# Patient Record
Sex: Male | Born: 1990 | Race: White | Hispanic: No | Marital: Single | Smoking: Current every day smoker
Health system: Southern US, Community
[De-identification: ages and names within clinical notes are randomized; demographics above are authoritative.]

## PROBLEM LIST (undated history)

## (undated) HISTORY — PX: APPENDECTOMY: SHX54

---

## 2015-04-13 ENCOUNTER — Ambulatory Visit (INDEPENDENT_AMBULATORY_CARE_PROVIDER_SITE_OTHER): Payer: BLUE CROSS/BLUE SHIELD

## 2015-04-13 ENCOUNTER — Encounter: Payer: Self-pay | Admitting: Podiatry

## 2015-04-13 ENCOUNTER — Ambulatory Visit (INDEPENDENT_AMBULATORY_CARE_PROVIDER_SITE_OTHER): Payer: BLUE CROSS/BLUE SHIELD | Admitting: Podiatry

## 2015-04-13 VITALS — BP 119/70 | HR 74 | Resp 16 | Ht 72.0 in | Wt 245.0 lb

## 2015-04-13 DIAGNOSIS — M79672 Pain in left foot: Secondary | ICD-10-CM

## 2015-04-13 DIAGNOSIS — M779 Enthesopathy, unspecified: Secondary | ICD-10-CM | POA: Diagnosis not present

## 2015-04-13 DIAGNOSIS — M79671 Pain in right foot: Secondary | ICD-10-CM | POA: Diagnosis not present

## 2015-04-13 DIAGNOSIS — M2040 Other hammer toe(s) (acquired), unspecified foot: Secondary | ICD-10-CM

## 2015-04-13 DIAGNOSIS — B372 Candidiasis of skin and nail: Secondary | ICD-10-CM | POA: Diagnosis not present

## 2015-04-13 MED ORDER — DICLOFENAC SODIUM 75 MG PO TBEC: 75.0000 mg | DELAYED_RELEASE_TABLET | Freq: Two times a day (BID) | ORAL | Status: DC

## 2015-04-13 MED ORDER — TERBINAFINE HCL 250 MG PO TABS: ORAL_TABLET | ORAL | Status: DC

## 2015-04-13 MED ORDER — TRIAMCINOLONE ACETONIDE 10 MG/ML IJ SUSP
10.0000 mg | Freq: Once | INTRAMUSCULAR | Status: AC
  Administered 2015-04-13: 10 mg

## 2015-04-13 NOTE — Progress Notes (Signed)
   Subjective:    Patient ID: Benjamin Clay, male    DOB: 06/25/1990, 25 y.o.   MRN: [id number]  HPI Patient presents with foot pain in their left foot; plantar forefoot-below 4th toe. Pt stated, "foot started to hurt after playing basketball";  x2 weeks   Review of Systems  All other systems reviewed and are negative.      Objective:   Physical Exam        Assessment & Plan:

## 2015-04-15 NOTE — Progress Notes (Signed)
Subjective:     Patient ID: Benjamin Clay, male   DOB: 05-Dec-1990, 25 y.o.   MRN: [id number]  HPI patient presents with a lot of pain underneath his left forefoot in the area of the fourth metatarsal phalangeal joint. Also has relatively dry skin bilateral that's localized in nature with nail disease which is been present. Patient has been present to the worse degree and the foot over the last few weeks   Review of Systems  All other systems reviewed and are negative.      Objective:   Physical Exam  Constitutional: He is oriented to person, place, and time.  Cardiovascular: Intact distal pulses.   Musculoskeletal: Normal range of motion.  Neurological: He is oriented to person, place, and time.  Skin: Skin is warm.  Nursing note and vitals reviewed.  Neurovascular status intact muscle strength adequate range of motion within normal limits with patient found to have discomfort in the fourth metatarsophalangeal joint left with elevation of the toe and pain within the joint and distal. Patient also has dry skin bilateral and history of interdigital itching and discoloration. Patient has good digital perfusion is well oriented 3     Assessment:     Probable inflammatory capsulitis left fourth MPJ with mycotic foot infection occurring bilateral    Plan:     H&P and x-rays left foot reviewed. Today I did a proximal nerve block left I aspirated the joint getting out a small amount of clear fluid and injected with half cc dexamethasone Kenalog and applied thick pad to reduce pressure on the joint surface. Discussed digital deformity and that it may need to be corrected at some day. Also placed him on Lamisil post pack to help him with his fungal infection and reappoint in 2 weeks  Indicates that there is no indications of fracture or arthritis with mild elevation of the digit noted

## 2015-04-27 ENCOUNTER — Ambulatory Visit: Payer: BLUE CROSS/BLUE SHIELD | Admitting: Podiatry

## 2015-09-09 ENCOUNTER — Encounter: Payer: Self-pay | Admitting: Emergency Medicine

## 2015-09-09 ENCOUNTER — Emergency Department
Admission: EM | Admit: 2015-09-09 | Discharge: 2015-09-09 | Disposition: A | Discharge status: Home / Self Care | Payer: BLUE CROSS/BLUE SHIELD | Attending: Emergency Medicine | Admitting: Emergency Medicine

## 2015-09-09 ENCOUNTER — Emergency Department: Payer: BLUE CROSS/BLUE SHIELD

## 2015-09-09 DIAGNOSIS — S40212A Abrasion of left shoulder, initial encounter: Secondary | ICD-10-CM | POA: Insufficient documentation

## 2015-09-09 DIAGNOSIS — Y9264 Mine or pit as the place of occurrence of the external cause: Secondary | ICD-10-CM | POA: Insufficient documentation

## 2015-09-09 DIAGNOSIS — F17210 Nicotine dependence, cigarettes, uncomplicated: Secondary | ICD-10-CM | POA: Insufficient documentation

## 2015-09-09 DIAGNOSIS — Y999 Unspecified external cause status: Secondary | ICD-10-CM | POA: Diagnosis not present

## 2015-09-09 DIAGNOSIS — T07XXXA Unspecified multiple injuries, initial encounter: Secondary | ICD-10-CM

## 2015-09-09 DIAGNOSIS — S40012A Contusion of left shoulder, initial encounter: Secondary | ICD-10-CM

## 2015-09-09 DIAGNOSIS — S4992XA Unspecified injury of left shoulder and upper arm, initial encounter: Secondary | ICD-10-CM | POA: Diagnosis present

## 2015-09-09 DIAGNOSIS — Y9389 Activity, other specified: Secondary | ICD-10-CM | POA: Diagnosis not present

## 2015-09-09 MED ORDER — OXYCODONE-ACETAMINOPHEN 5-325 MG PO TABS
1.0000 | ORAL_TABLET | Freq: Once | ORAL | Status: AC
  Administered 2015-09-09: 1 via ORAL
  Filled 2015-09-09: qty 1
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
## 2015-09-09 NOTE — ED Notes (Signed)
Ice pack applied.

## 2015-09-09 NOTE — ED Notes (Addendum)
Pt status discussed with Dr. Derrill KayGoodman. Ct of head / neck offered to pt per MD request. Pt refused. States he currently has no pain to his head or neck.

## 2016-12-09 ENCOUNTER — Ambulatory Visit
Admission: EM | Admit: 2016-12-09 | Discharge: 2016-12-09 | Disposition: A | Discharge status: Home / Self Care | Payer: BLUE CROSS/BLUE SHIELD | Attending: Family Medicine | Admitting: Family Medicine

## 2016-12-09 ENCOUNTER — Encounter: Payer: Self-pay | Admitting: *Deleted

## 2016-12-09 DIAGNOSIS — J069 Acute upper respiratory infection, unspecified: Secondary | ICD-10-CM | POA: Diagnosis not present

## 2016-12-09 DIAGNOSIS — R040 Epistaxis: Secondary | ICD-10-CM | POA: Diagnosis not present

## 2016-12-09 MED ORDER — BENZONATATE 200 MG PO CAPS: ORAL_CAPSULE | ORAL | 0 refills | Status: DC

## 2016-12-09 MED ORDER — FLUTICASONE PROPIONATE 50 MCG/ACT NA SUSP: 2.0000 | Freq: Every day | NASAL | 0 refills | Status: DC

## 2016-12-09 NOTE — Discharge Instructions (Signed)
Flonase on a daily basis.  Recommend coolmist vaporizer at night while the heat is on.  Follow up with ENT

## 2016-12-09 NOTE — ED Provider Notes (Signed)
[facility]    CSN: [id number] Arrival date & time: 12/09/16  1253     History   Chief Complaint Chief Complaint  Patient presents with  . Cough  . Epistaxis    HPI Benjamin Clay is a 26 y.o. male.   HPI  26 year old male who presents with a productive cough of yellow sputum sinus drainage and a nosebleed that began this morning from the left naris.  He was able to stop the bleeding area the symptoms have been progressing over 4-day period of time.  Eyes any fever or chills.  Non-smoker.        History reviewed. No pertinent past medical history.  There are no active problems to display for this patient.   Past Surgical History:  Procedure Laterality Date  . APPENDECTOMY         Home Medications    Prior to Admission medications   Medication Sig Start Date End Date Taking? Authorizing Provider  benzonatate (TESSALON) 200 MG capsule Take one cap TID PRN cough 12/09/16   Ovid Curdoemer, Chauncy Mangiaracina P, PA-C  fluticasone [facility](FLONASE) 50 MCG/ACT nasal spray Place 2 sprays into both nostrils daily. 12/09/16   Lutricia Feiloemer, Deja Pisarski P, PA-C    Family History Family History  Problem Relation Age of Onset  . Healthy Mother   . Healthy Father     Social History Social History   Tobacco Use  . Smoking status: Current Every Day Smoker    Packs/day: 0.50    Types: Cigarettes  . Smokeless tobacco: Never Used  Substance Use Topics  . Alcohol use: Yes    Alcohol/week: 0.0 oz  . Drug use: Not on file     Allergies   Patient has no known allergies.   Review of Systems Review of Systems  Constitutional: Positive for activity change. Negative for chills, fatigue and fever.  HENT: Positive for congestion, nosebleeds, postnasal drip and rhinorrhea.   Respiratory: Positive for cough.   All other systems reviewed and are negative.    Physical Exam Triage Vital Signs ED Triage Vitals  Enc Vitals Group     BP 12/09/16 1324 127/72     Pulse Rate 12/09/16 1324 (!) 58       Resp 12/09/16 1324 16     Temp 12/09/16 1324 98.1 F (36.7 C)     Temp Source 12/09/16 1324 Oral     SpO2 12/09/16 1324 98 %     Weight 12/09/16 1326 210 lb (95.3 kg)     Height 12/09/16 1326 6\' 1"  (1.854 m)     Head Circumference --      Peak Flow --      Pain Score --      Pain Loc --      Pain Edu? --      Excl. in GC? --    No data found.  Updated Vital Signs BP 127/72 (BP Location: Left Arm)   Pulse (!) 58   Temp 98.1 F (36.7 C) (Oral)   Resp 16   Ht 6\' 1"  (1.854 m)   Wt 210 lb (95.3 kg)   SpO2 98%   BMI 27.71 kg/m   Visual Acuity Right Eye Distance:   Left Eye Distance:   Bilateral Distance:    Right Eye Near:   Left Eye Near:    Bilateral Near:     Physical Exam  Constitutional: He is oriented to person, place, and time. He appears well-developed and well-nourished. No distress.  HENT:  Head: Normocephalic.  Right Ear: External ear normal.  Left Ear: External ear normal.  Mouth/Throat: Oropharynx is clear and moist. No oropharyngeal exudate.  Examination of the nostrils shows the left nostril to be more erythematous along the turbinate.  No active bleeding is seen.  Eyes: Pupils are equal, round, and reactive to light. Right eye exhibits no discharge. Left eye exhibits no discharge.  Neck: Normal range of motion.  Pulmonary/Chest: Effort normal and breath sounds normal.  Musculoskeletal: Normal range of motion.  Lymphadenopathy:    He has no cervical adenopathy.  Neurological: He is alert and oriented to person, place, and time.  Skin: Skin is warm and dry. He is not diaphoretic.  Psychiatric: He has a normal mood and affect. His behavior is normal. Judgment and thought content normal.  Nursing note and vitals reviewed.    UC Treatments / Results  Labs (all labs ordered are listed, but only abnormal results are displayed) Labs Reviewed - No data to display  EKG  EKG Interpretation None       Radiology No results  found.  Procedures Procedures (including critical care time)  Medications Ordered in UC Medications - No data to display   Initial Impression / Assessment and Plan / UC Course  I have reviewed the triage vital signs and the nursing notes.  Pertinent labs & imaging results that were available during my care of the patient were reviewed by me and considered in my medical decision making (see chart for details).     Plan: 1. Test/x-ray results and diagnosis reviewed with patient 2. rx as per orders; risks, benefits, potential side effects reviewed with patient 3. Recommend supportive treatment with cool mist vaporizer at nighttime.  Flonase daily.  Cautioned regarding possibility of increased bleeding.  Recommend follow-up with an ENT.  Name and phone number provided to the patient. 4. F/u prn if symptoms worsen or don't improve   Final Clinical Impressions(s) / UC Diagnoses   Final diagnoses:  Epistaxis  Upper respiratory tract infection, unspecified type    ED Discharge Orders        Ordered    benzonatate (TESSALON) 200 MG capsule     12/09/16 1420    fluticasone (FLONASE) 50 MCG/ACT nasal spray  Daily     12/09/16 1420       Controlled Substance Prescriptions [place] Controlled Substance Registry consulted? Not Applicable   Lutricia FeilRoemer, Yasmin Dibello P, PA-C 12/09/16 1431

## 2016-12-09 NOTE — ED Triage Notes (Signed)
Productive cough- yellow, nose bleed this am, bleeding presently controlled, and head congestion x 4 days.
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
## 2020-11-26 ENCOUNTER — Ambulatory Visit: Admit: 2020-11-26 | Discharge status: Absent without leave | Payer: BLUE CROSS/BLUE SHIELD

## 2021-06-09 ENCOUNTER — Ambulatory Visit
Admission: EM | Admit: 2021-06-09 | Discharge: 2021-06-09 | Disposition: A | Discharge status: Home / Self Care | Payer: BLUE CROSS/BLUE SHIELD | Attending: Emergency Medicine | Admitting: Emergency Medicine

## 2021-06-09 ENCOUNTER — Other Ambulatory Visit: Payer: Self-pay

## 2021-06-09 ENCOUNTER — Encounter: Payer: Self-pay | Admitting: Emergency Medicine

## 2021-06-09 ENCOUNTER — Ambulatory Visit (INDEPENDENT_AMBULATORY_CARE_PROVIDER_SITE_OTHER): Payer: BLUE CROSS/BLUE SHIELD

## 2021-06-09 DIAGNOSIS — S60551A Superficial foreign body of right hand, initial encounter: Secondary | ICD-10-CM

## 2021-06-09 MED ORDER — TETANUS-DIPHTH-ACELL PERTUSSIS 5-2.5-18.5 LF-MCG/0.5 IM SUSY: 0.5000 mL | PREFILLED_SYRINGE | Freq: Once | INTRAMUSCULAR | Status: DC

## 2021-06-09 MED ORDER — AMOXICILLIN-POT CLAVULANATE 875-125 MG PO TABS: 1.0000 | ORAL_TABLET | Freq: Two times a day (BID) | ORAL | 0 refills | Status: AC

## 2021-06-09 NOTE — Discharge Instructions (Addendum)
I have put an urgent referral to Dr. Amanda Pea in [place], but Dr. Ardyth Harps is a hand surgeon here in [place].  Please call either office and try and get an appointment with them as soon as you can.  Warm compresses.  Augmentin as I am concerned that this could get secondarily infected.
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
## 2021-06-09 NOTE — ED Triage Notes (Signed)
Patient states that he thinks that he still might have a splinter stuck in his right hand for about a week.  Patient reports swelling and pain at the site.

## 2023-02-26 IMAGING — CR DG HAND COMPLETE 3+V*R*
3 series · 3 of 3 positions shown · non-contrast
Comparison: None Available.

CLINICAL DATA: Splinter in hand for 1 week.  Swelling and pain.

EXAM:
RIGHT HAND - COMPLETE 3+ VIEW

[hand ap]
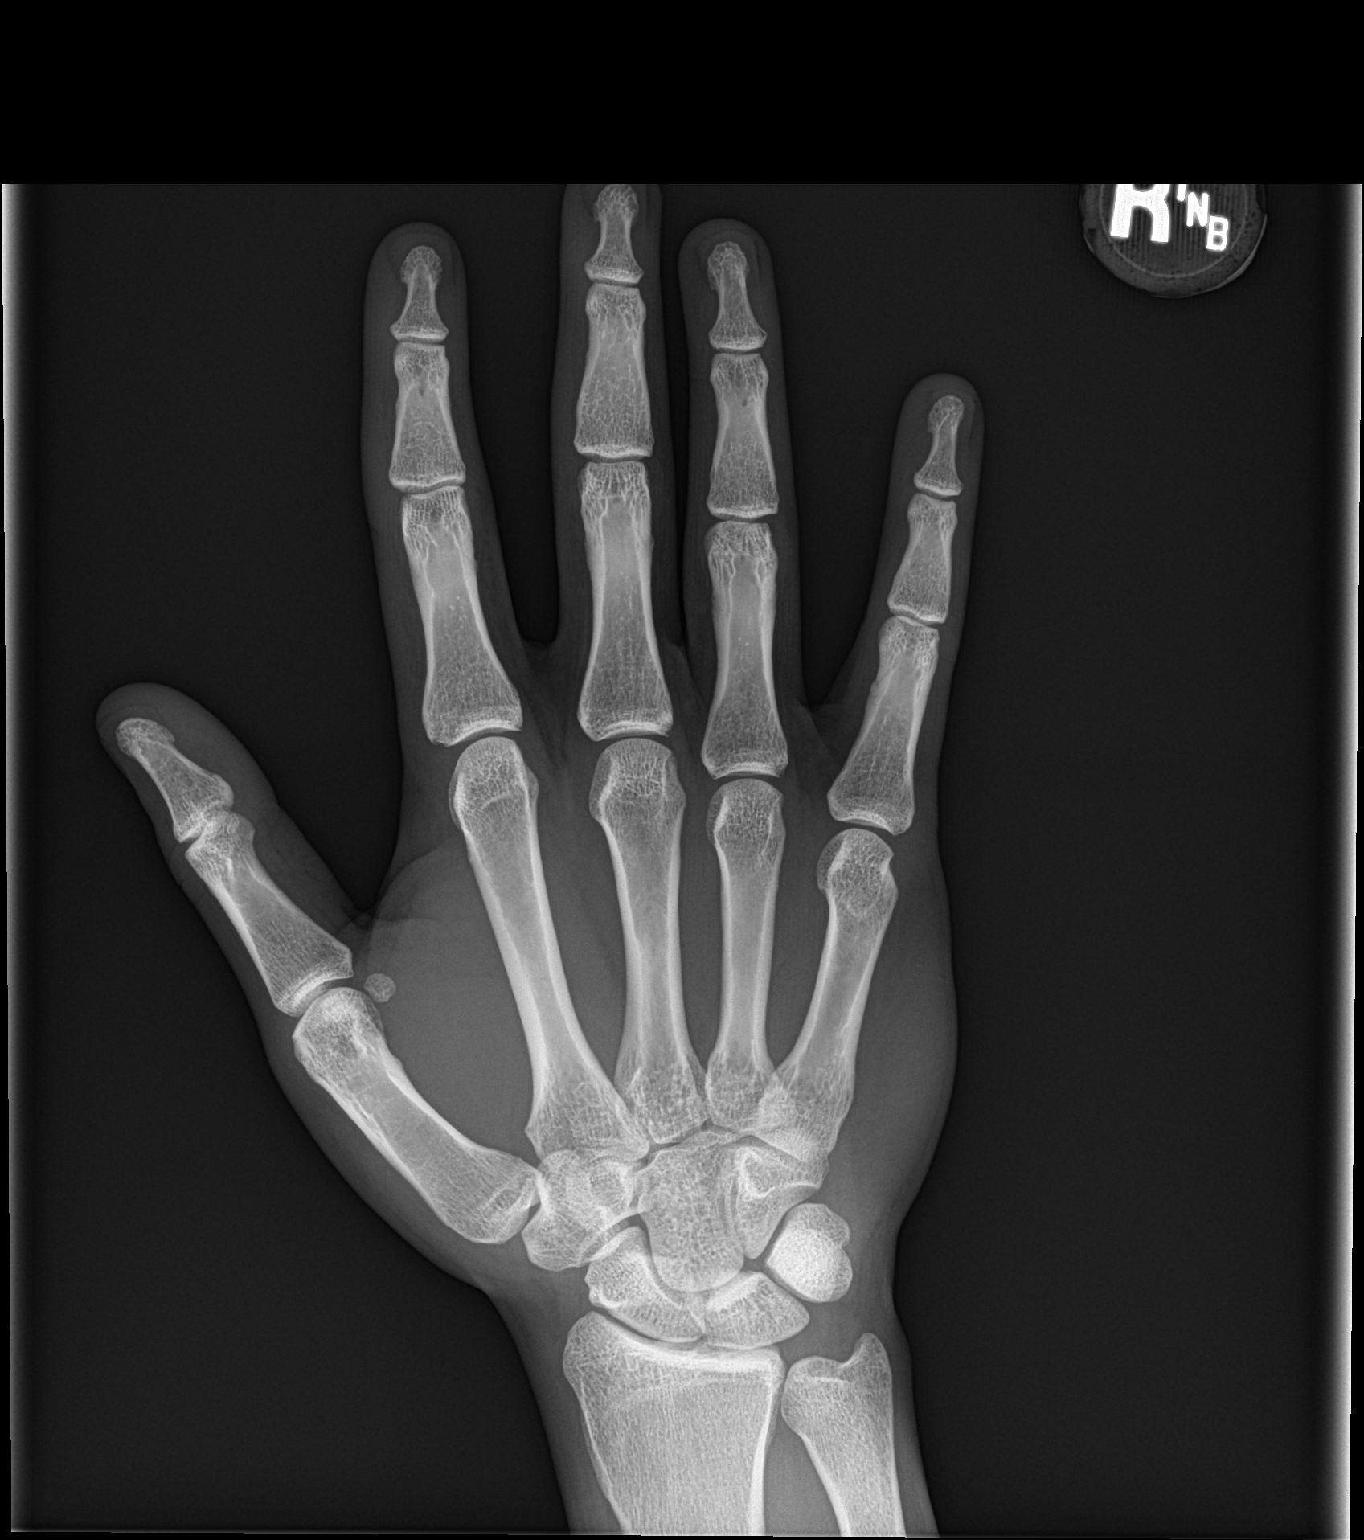

[hand obl]
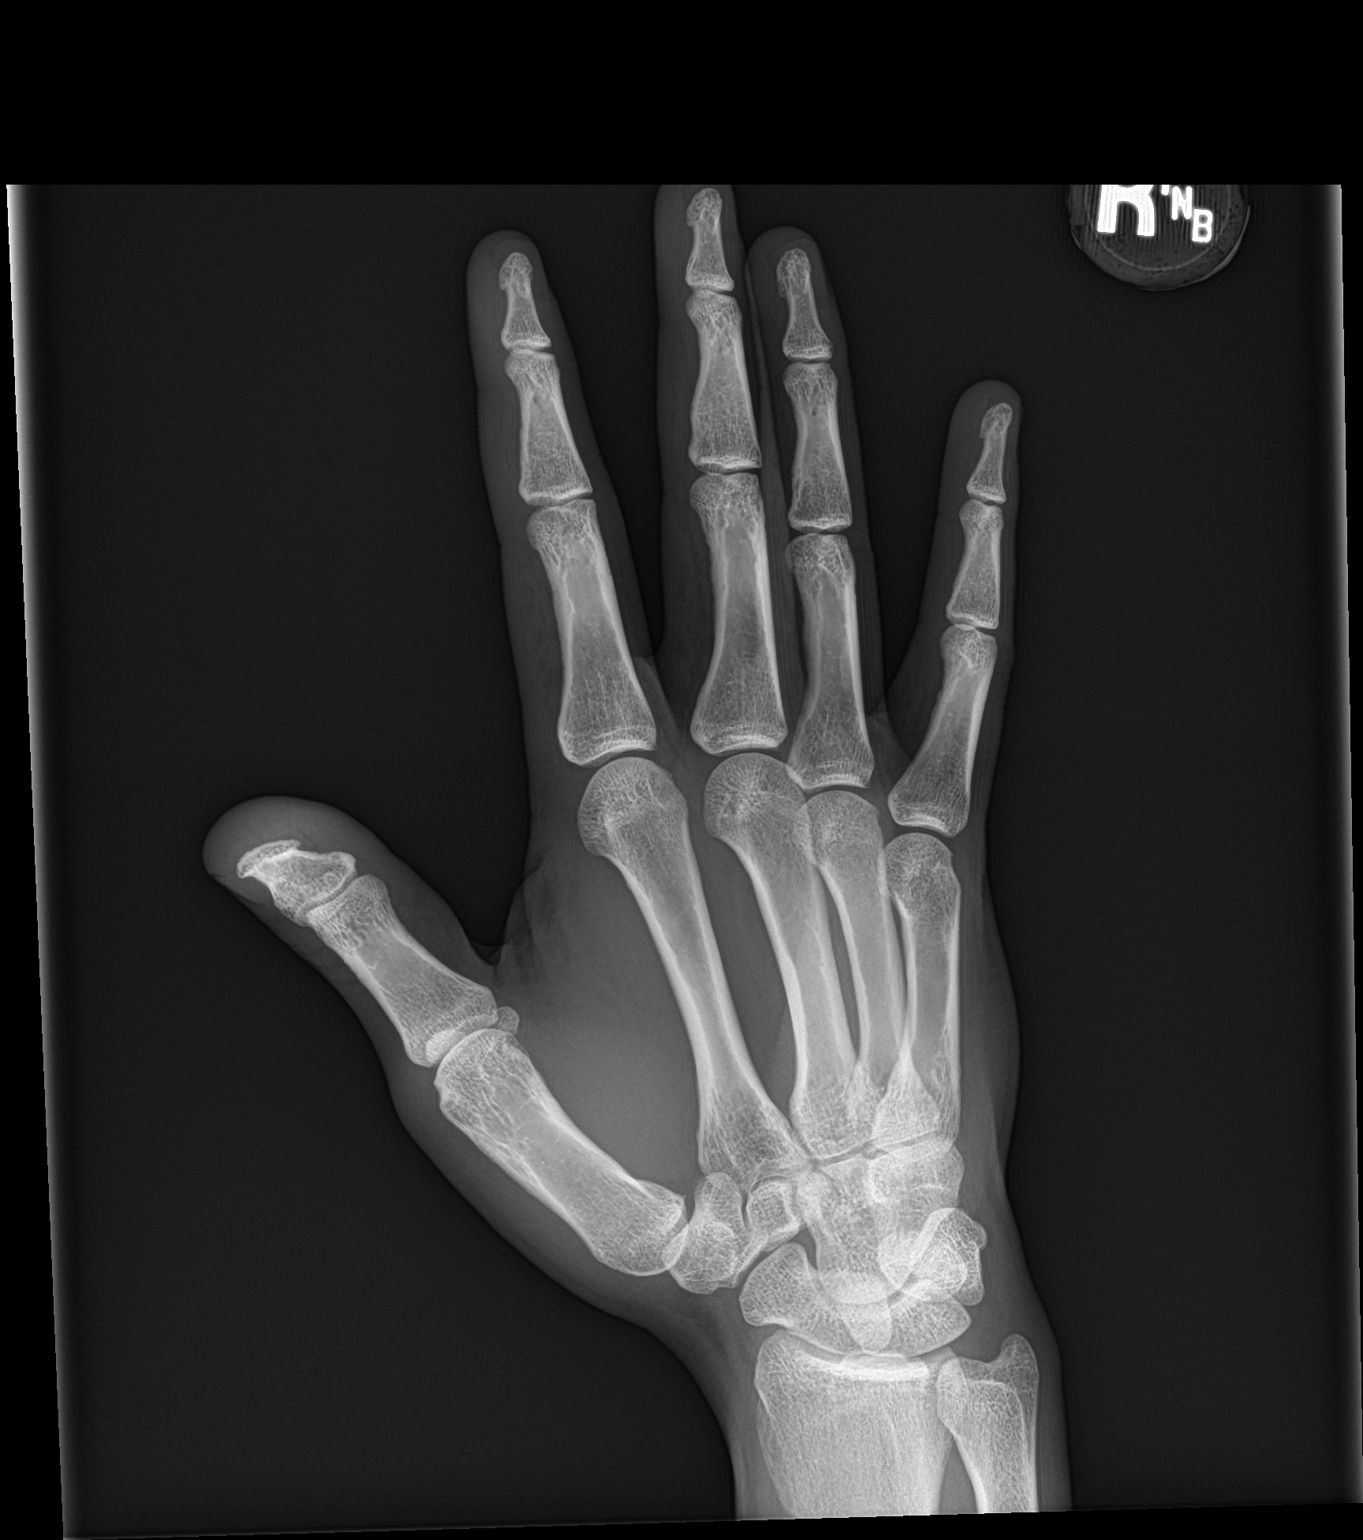

[hand lat]
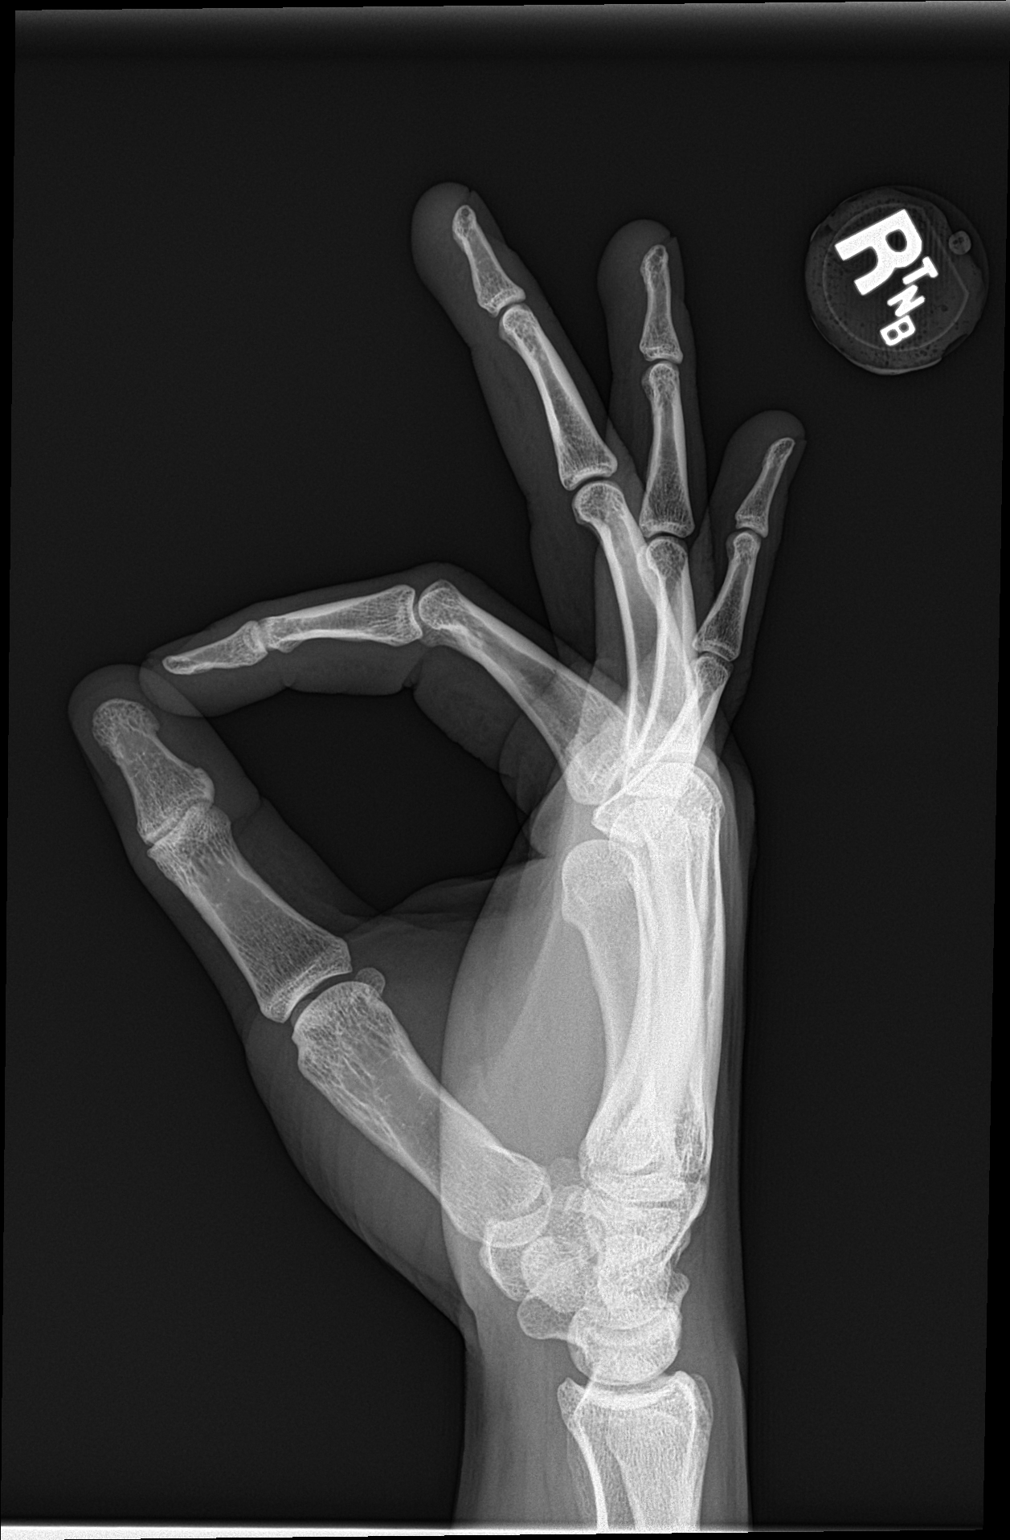

[3 of 3 positions shown; findings below may reference images not displayed]

FINDINGS: No acute fracture or dislocation. Probable soft tissue swelling
about the dorsal aspect of the metacarpals on the lateral view. No
radiopaque foreign object. No soft tissue gas.
IMPRESSION: Soft tissue swelling, without acute osseous abnormality.
